# Patient Record
Sex: Male | Born: 1985 | Hispanic: No | Marital: Married | Smoking: Never smoker
Health system: Southern US, Community
[De-identification: ages and names within clinical notes are randomized; demographics above are authoritative.]

## PROBLEM LIST (undated history)

## (undated) DIAGNOSIS — K859 Acute pancreatitis without necrosis or infection, unspecified: Secondary | ICD-10-CM

## (undated) DIAGNOSIS — L309 Dermatitis, unspecified: Secondary | ICD-10-CM

## (undated) HISTORY — PX: TONSILLECTOMY: SUR1361

## (undated) HISTORY — DX: Dermatitis, unspecified: L30.9

---

## 2020-11-02 ENCOUNTER — Encounter (HOSPITAL_COMMUNITY): Payer: Self-pay

## 2020-11-02 ENCOUNTER — Emergency Department (HOSPITAL_COMMUNITY)
Admission: EM | Admit: 2020-11-02 | Discharge: 2020-11-03 | Disposition: A | Discharge status: Home / Self Care | Payer: 59 | Attending: Emergency Medicine | Admitting: Emergency Medicine

## 2020-11-02 ENCOUNTER — Other Ambulatory Visit: Payer: Self-pay

## 2020-11-02 DIAGNOSIS — Z5321 Procedure and treatment not carried out due to patient leaving prior to being seen by health care provider: Secondary | ICD-10-CM | POA: Insufficient documentation

## 2020-11-02 DIAGNOSIS — R1010 Upper abdominal pain, unspecified: Secondary | ICD-10-CM | POA: Diagnosis not present

## 2020-11-02 HISTORY — DX: Acute pancreatitis without necrosis or infection, unspecified: K85.90

## 2020-11-02 LAB — CBC
HCT: 42.8 % (ref 39.0–52.0)
Hemoglobin: 14.1 g/dL (ref 13.0–17.0)
MCH: 27.6 pg (ref 26.0–34.0)
MCHC: 32.9 g/dL (ref 30.0–36.0)
MCV: 83.8 fL (ref 80.0–100.0)
Platelets: 173 10*3/uL (ref 150–400)
RBC: 5.11 MIL/uL (ref 4.22–5.81)
RDW: 12.4 % (ref 11.5–15.5)
WBC: 5.6 10*3/uL (ref 4.0–10.5)
nRBC: 0 % (ref 0.0–0.2)

## 2020-11-02 LAB — COMPREHENSIVE METABOLIC PANEL
ALT: 24 U/L (ref 0–44)
AST: 35 U/L (ref 15–41)
Albumin: 4.2 g/dL (ref 3.5–5.0)
Alkaline Phosphatase: 44 U/L (ref 38–126)
Anion gap: 6 (ref 5–15)
BUN: 10 mg/dL (ref 6–20)
CO2: 26 mmol/L (ref 22–32)
Calcium: 9.2 mg/dL (ref 8.9–10.3)
Chloride: 106 mmol/L (ref 98–111)
Creatinine, Ser: 1.42 mg/dL — ABNORMAL HIGH (ref 0.61–1.24)
GFR, Estimated: >60 mL/min (ref >60)
Glucose, Bld: 100 mg/dL — ABNORMAL HIGH (ref 70–99)
Potassium: 4.3 mmol/L (ref 3.5–5.1)
Sodium: 138 mmol/L (ref 135–145)
Total Bilirubin: 1.1 mg/dL (ref 0.3–1.2)
Total Protein: 7.5 g/dL (ref 6.5–8.1)

## 2020-11-02 LAB — LIPASE, BLOOD: Lipase: 64 U/L — ABNORMAL HIGH (ref 11–51)

## 2020-11-02 NOTE — ED Notes (Signed)
Patient left on own accord °

## 2020-11-02 NOTE — ED Triage Notes (Signed)
Pt reports that he has been having right upper abdominal pain x 3 days. Diagnosed with pancreatitis 2 weeks ago. Denies nausea or vomiting. Denies fever. Worsens after eating.

## 2020-11-19 ENCOUNTER — Other Ambulatory Visit: Payer: Self-pay | Admitting: Gastroenterology

## 2020-11-19 ENCOUNTER — Other Ambulatory Visit (HOSPITAL_COMMUNITY): Payer: Self-pay | Admitting: Gastroenterology

## 2020-11-19 DIAGNOSIS — K859 Acute pancreatitis without necrosis or infection, unspecified: Secondary | ICD-10-CM

## 2020-12-11 ENCOUNTER — Encounter (HOSPITAL_COMMUNITY): Payer: Self-pay

## 2020-12-11 ENCOUNTER — Ambulatory Visit (HOSPITAL_COMMUNITY): Payer: 59

## 2020-12-29 ENCOUNTER — Ambulatory Visit (HOSPITAL_COMMUNITY)
Admission: RE | Admit: 2020-12-29 | Discharge: 2020-12-29 | Disposition: A | Discharge status: Home / Self Care | Payer: 59 | Source: Ambulatory Visit | Attending: Gastroenterology | Admitting: Gastroenterology

## 2020-12-29 ENCOUNTER — Other Ambulatory Visit: Payer: Self-pay

## 2020-12-29 DIAGNOSIS — K859 Acute pancreatitis without necrosis or infection, unspecified: Secondary | ICD-10-CM | POA: Diagnosis present

## 2020-12-29 MED ORDER — TECHNETIUM TC 99M MEBROFENIN IV KIT
5.3000 | PACK | Freq: Once | INTRAVENOUS | Status: AC | PRN
  Administered 2020-12-29: 5.3 via INTRAVENOUS

## 2020-12-30 ENCOUNTER — Encounter: Payer: Self-pay | Admitting: Allergy

## 2020-12-30 ENCOUNTER — Other Ambulatory Visit: Payer: Self-pay

## 2020-12-30 ENCOUNTER — Ambulatory Visit (INDEPENDENT_AMBULATORY_CARE_PROVIDER_SITE_OTHER): Payer: 59 | Admitting: Allergy

## 2020-12-30 VITALS — BP 122/82 | HR 59 | Temp 97.5°F | Resp 16 | Ht 73.31 in | Wt 225.4 lb

## 2020-12-30 DIAGNOSIS — R899 Unspecified abnormal finding in specimens from other organs, systems and tissues: Secondary | ICD-10-CM | POA: Insufficient documentation

## 2020-12-30 DIAGNOSIS — H1013 Acute atopic conjunctivitis, bilateral: Secondary | ICD-10-CM

## 2020-12-30 DIAGNOSIS — J3089 Other allergic rhinitis: Secondary | ICD-10-CM

## 2020-12-30 MED ORDER — FLUTICASONE PROPIONATE 50 MCG/ACT NA SUSP: 1.0000 | Freq: Two times a day (BID) | NASAL | 3 refills | Status: AC | PRN

## 2020-12-30 MED ORDER — CROMOLYN SODIUM 4 % OP SOLN: 1.0000 [drp] | Freq: Four times a day (QID) | OPHTHALMIC | 3 refills | Status: AC | PRN

## 2020-12-30 NOTE — Progress Notes (Signed)
New Patient Note  RE: Jacob Gill MRN: [id number] DOB: Jan 29, 1986 Date of Office Visit: 12/30/2020  Consult requested by: Kathi Der, MD Primary care provider: Joycelyn Rua, MD  Chief Complaint: elevated IgG 4 level  History of Present Illness: I had the pleasure of seeing [name] for initial evaluation at the [facility] on 12/30/2020. He is a 35 y.o. male, who is referred here by Dr. 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  Patient was having a lot of reflux symptoms and had mild acute pancreatitis - he had some elevated lipase and CT showed some mild inflammation in the ER. This was treated with fluids as outpatient.   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 Pancreatitis resolved and last lipase was normal.   Patient has NO history multiple infections including sinus infection, pneumonia, ear infections, GI infections/diarrhea, skin infections/abscesses. Patient has NO history of opportunistic infections including fungal infections, viral infections.    Patient labs from 11/20/2020: IgG 1337 IgG1 740 IgG2 264 IgG3 24 IgG4 157 (range 2-96)  Patient reports 0 antibiotic use in the last 12 months and 0 hospital admissions. Patient does not have any secondary causes of immunodeficiency including chronic steroid use, diabetes mellitus, protein losing enteropathy, hepatic dysfunction, history of cancer or irradiation or history of HIV, hepatitis B or C.   He has some slightly elevated Cr and is being followed by nephrology for this.  Assessment and Plan: Abdi is a 35 y.o. male with: Abnormal laboratory test result Patient referred by GI for IgG subclass 4 elevated at 157 (normal range 2-96)  after being work up for a mild case on  pancreatitis which resolved with outpatient therapy. Normal total IgG level. Denies any frequent infections or antibiotics use. Currently lipase back to normal.  Discussed with patient at length that given above clinical history and lab results, there is no indication for any further work up from immunology standpoint. If he is still concerned about autoimmune issues then recommend rheumatology evaluation next.   Other allergic rhinitis Perennial rhino conjunctivitis symptoms for 10 years. Tried zyrtec with some benefit. No prior work up. Would like environmental allergy testing done today.  Today's skin testing showed: Positive to tree pollen and cockroach.   Start environmental control measures as below.  May use over the counter antihistamines such as Zyrtec (cetirizine), Claritin (loratadine), Allegra (fexofenadine), or Xyzal (levocetirizine) daily as needed.  May use cromolyn 4% 1 drop in each eye up to four times a day as needed for itchy/watery eyes.   May use Flonase (fluticasone) nasal spray 1 spray per nostril twice a day as needed for nasal congestion.   Nasal saline spray (i.e., Simply Saline) or nasal saline lavage (i.e., NeilMed) is recommended as needed and prior to medicated nasal sprays.  Allergic conjunctivitis of both eyes  See assessment and plan as above for allergic rhinitis.  Return if symptoms worsen or fail to improve.  Meds ordered this encounter  Medications  . cromolyn (OPTICROM) 4 % ophthalmic solution    Sig: Place 1 drop into both eyes 4 (four) times daily as needed (itchy/watery eyes).    Dispense:  10 mL    Refill:  3  . fluticasone (FLONASE) 50 MCG/ACT nasal spray  Sig: Place 1 spray into both nostrils 2 (two) times daily as needed for allergies or rhinitis.    Dispense:  16 g    Refill:  3   Lab Orders  No laboratory test(s) ordered today    Other allergy screening: Asthma: no Rhino conjunctivitis: yes  He reports symptoms of nasal  congestion, rhinorrhea, itchy eyes. Symptoms have been going on for 10 years. The symptoms are present all year around. Other triggers include exposure to unsure. Anosmia: no. He has used zyrtec 10mg  daily with fair improvement in symptoms. Previous work up includes: none. History of reflux: yes and takes PPI.  Food allergy: no Medication allergy: no Hymenoptera allergy: no Urticaria: in 2014 x few months with no triggers. He was treated with zyrtec daily.  Eczema:no  Diagnostics: Skin Testing: Environmental allergy panel. Positive test to: tree pollen and cockroaches.  Results discussed with patient/family.  Airborne Adult Perc - 12/30/20 1409    Time Antigen Placed 1409    Allergen Manufacturer Waynette ButteryGreer    Location Back    Number of Test 59    Panel 1 Select    1. Control-Buffer 50% Glycerol Negative    2. Control-Histamine 1 mg/ml 2+    3. Albumin saline Negative    4. Bahia Negative    5. French Southern Territories[place]ermuda Negative    6. Johnson Negative    7. Kentucky Blue Negative    8. Meadow Fescue Negative    9. Perennial Rye Negative    10. Sweet Vernal Negative    11. Timothy Negative    12. Cocklebur Negative    13. Burweed Marshelder Negative    14. Ragweed, short Negative    15. Ragweed, Giant Negative    16. Plantain,  English Negative    17. Lamb's Quarters Negative    18. Sheep Sorrell Negative    19. Rough Pigweed Negative    20. Marsh Elder, Rough Negative    21. Mugwort, Common Negative    22. Ash mix Negative    23. Birch mix Negative    24. Beech American Negative    25. Box, Elder Negative    26. Cedar, red Negative    27. Cottonwood, Guinea-BissauEastern Negative    28. Elm mix Negative    29. Hickory 3+    30. Maple mix Negative    31. Oak, Guinea-Bissau[place]astern mix Negative    32. Pecan Pollen 2+    33. Pine mix Negative    34. Sycamore Eastern Negative    35. Walnut, Black Pollen Negative    36. Alternaria alternata Negative    37. Cladosporium Herbarum Negative    38. Aspergillus mix  Negative    39. Penicillium mix Negative    40. Bipolaris sorokiniana (Helminthosporium) Negative    41. Drechslera spicifera (Curvularia) Negative    42. Mucor plumbeus Negative    43. Fusarium moniliforme Negative    44. Aureobasidium pullulans (pullulara) Negative    45. Rhizopus oryzae Negative    46. Botrytis cinera Negative    47. Epicoccum nigrum Negative    48. Phoma betae Negative    49. Candida Albicans Negative    50. Trichophyton mentagrophytes Negative    51. Mite, D Farinae  5,000 AU/ml Negative    52. Mite, D Pteronyssinus  5,000 AU/ml Negative    53. Cat Hair 10,000 BAU/ml Negative    54.  Dog Epithelia Negative    55. Mixed Feathers Negative    56. Horse Epithelia Negative  57. Cockroach, German 2+    58. Mouse Negative    59. Tobacco Leaf Negative           Past Medical History: Patient Active Problem List   Diagnosis Date Noted  . Other allergic rhinitis 12/30/2020  . Allergic conjunctivitis of both eyes 12/30/2020  . Abnormal laboratory test result 12/30/2020   Past Medical History:  Diagnosis Date  . Eczema   . Pancreatitis    Past Surgical History: Past Surgical History:  Procedure Laterality Date  . TONSILLECTOMY     Medication List:  Current Outpatient Medications  Medication Sig Dispense Refill  . cabergoline (DOSTINEX) 0.5 MG tablet Take 0.5 mg by mouth 2 (two) times a week.    . cetirizine (ZYRTEC) 10 MG tablet Take by mouth.    . Cholecalciferol 25 MCG (1000 UT) tablet Take by mouth as needed.    . clindamycin (CLEOCIN T) 1 % external solution as needed.    . cromolyn (OPTICROM) 4 % ophthalmic solution Place 1 drop into both eyes 4 (four) times daily as needed (itchy/watery eyes). 10 mL 3  . fluticasone (FLONASE) 50 MCG/ACT nasal spray Place 1 spray into both nostrils 2 (two) times daily as needed for allergies or rhinitis. 16 g 3  . pantoprazole (PROTONIX) 20 MG tablet Take 40 mg by mouth daily.    . pantoprazole (PROTONIX) 40 MG  tablet 1 tablet     No current facility-administered medications for this visit.   Allergies: No Known Allergies Social History: Social History   Socioeconomic History  . Marital status: Married    Spouse name: Not on file  . Number of children: Not on file  . Years of education: Not on file  . Highest education level: Not on file  Occupational History  . Not on file  Tobacco Use  . Smoking status: Never Smoker  . Smokeless tobacco: Never Used  Vaping Use  . Vaping Use: Never used  Substance and Sexual Activity  . Alcohol use: Not Currently  . Drug use: Not Currently  . Sexual activity: Not on file  Other Topics Concern  . Not on file  Social History Narrative  . Not on file   Social Determinants of Health   Financial Resource Strain: Not on file  Food Insecurity: Not on file  Transportation Needs: Not on file  Physical Activity: Not on file  Stress: Not on file  Social Connections: Not on file   Lives in a 34 year old house. 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 Allergic rhinitis Father    Review of Systems  Constitutional: Negative for appetite change, chills, fever and unexpected weight change.  HENT: Positive for congestion. Negative for rhinorrhea.   Eyes: Positive for itching.  Respiratory: Negative for cough, chest tightness, shortness of breath and wheezing.   Cardiovascular: Negative for chest pain.  Gastrointestinal: Negative for abdominal pain.  Genitourinary: Negative for difficulty urinating.  Skin: Negative for rash.  Allergic/Immunologic: Positive for environmental allergies.  Neurological: Negative for headaches.   Objective: BP 122/82   Pulse (!) 59   Temp (!) 97.5 F (36.4 C) (Temporal)   Resp 16    Ht 6' 1.31" (1.862 m)   Wt 225 lb 6.4 oz (102.2 kg)   SpO2 98%  BMI 29.49 kg/m  Body mass index is 29.49 kg/m. Physical Exam Vitals and nursing note reviewed.  Constitutional:      Appearance: Normal appearance. He is well-developed.  HENT:     Head: Normocephalic and atraumatic.     Right Ear: Tympanic membrane and external ear normal.     Left Ear: Tympanic membrane and external ear normal.     Nose: Nose normal.     Mouth/Throat:     Mouth: Mucous membranes are moist.     Pharynx: Oropharynx is clear.  Eyes:     Conjunctiva/sclera: Conjunctivae normal.  Cardiovascular:     Rate and Rhythm: Normal rate and regular rhythm.     Heart sounds: Normal heart sounds. No murmur heard. No friction rub. No gallop.   Pulmonary:     Effort: Pulmonary effort is normal.     Breath sounds: Normal breath sounds. No wheezing, rhonchi or rales.  Musculoskeletal:     Cervical back: Neck supple.  Skin:    General: Skin is warm.     Findings: No rash.  Neurological:     Mental Status: He is alert and oriented to person, place, and time.  Psychiatric:        Behavior: Behavior normal.    The plan was reviewed with the patient/family, and all questions/concerned were addressed.  It was my pleasure to see Keywon today and participate in his care. Please feel free to contact me with any questions or concerns.  Sincerely,  Wyline Mood, DO Allergy & Immunology  [facility] office: [phone] [facility] office: [phone]

## 2020-12-30 NOTE — Assessment & Plan Note (Addendum)
Patient referred by GI for IgG subclass 4 elevated at 157 (normal range 2-96)  after being work up for a mild case on pancreatitis which resolved with outpatient therapy. Normal total IgG level. Denies any frequent infections or antibiotics use. Currently lipase back to normal.  Discussed with patient at length that given above clinical history and lab results, there is no indication for any further work up from immunology standpoint. If he is still concerned about autoimmune issues then recommend rheumatology evaluation next.

## 2020-12-30 NOTE — Patient Instructions (Addendum)
Today's skin testing showed: Positive to tree pollen and cockroach.   Environmental allergies  Start environmental control measures as below.  May use over the counter antihistamines such as Zyrtec (cetirizine), Claritin (loratadine), Allegra (fexofenadine), or Xyzal (levocetirizine) daily as needed.  May use cromolyn 4% 1 drop in each eye up to four times a day as needed for itchy/watery eyes.   May use Flonase (fluticasone) nasal spray 1 spray per nostril twice a day as needed for nasal congestion.   Nasal saline spray (i.e., Simply Saline) or nasal saline lavage (i.e., NeilMed) is recommended as needed and prior to medicated nasal sprays.  Elevated IgG subclass 4  No need for any further evaluation from immunology standpoint at this time.  Follow up as needed.  Reducing Pollen Exposure . Pollen seasons: trees (spring), grass (summer) and ragweed/weeds (fall). Marland Kitchen Keep windows closed in your home and car to lower pollen exposure.  [name] air conditioning in the bedroom and throughout the house if possible.  . Avoid going out in dry windy days - especially early morning. . Pollen counts are highest between 5 - 10 AM and on dry, hot and windy days.  . Save outside activities for late afternoon or after a heavy rain, when pollen levels are lower.  . Avoid mowing of grass if you have grass pollen allergy. Marland Kitchen Be aware that pollen can also be transported indoors on people and pets.  . Dry your clothes in an automatic dryer rather than hanging them outside where they might collect pollen.  . Rinse hair and eyes before bedtime.  Cockroach Allergen Avoidance Cockroaches are often found in the homes of densely populated urban areas, schools or commercial buildings, but these creatures can lurk almost anywhere. This does not mean that you have a dirty house or living area. . Block all areas where roaches can enter the home. This includes crevices, wall cracks and windows.  . Cockroaches  need water to survive, so fix and seal all leaky faucets and pipes. Have an exterminator go through the house when your family and pets are gone to eliminate any remaining roaches. Marland Kitchen Keep food in lidded containers and put pet food dishes away after your pets are done eating. Vacuum and sweep the floor after meals, and take out garbage and recyclables. Use lidded garbage containers in the kitchen. Wash dishes immediately after use and clean under stoves, refrigerators or toasters where crumbs can accumulate. Wipe off the stove and other kitchen surfaces and cupboards regularly.

## 2020-12-30 NOTE — Assessment & Plan Note (Signed)
   See assessment and plan as above for allergic rhinitis.  

## 2020-12-30 NOTE — Assessment & Plan Note (Signed)
Perennial rhino conjunctivitis symptoms for 10 years. Tried zyrtec with some benefit. No prior work up. Would like environmental allergy testing done today.  Today's skin testing showed: Positive to tree pollen and cockroach.   Start environmental control measures as below.  May use over the counter antihistamines such as Zyrtec (cetirizine), Claritin (loratadine), Allegra (fexofenadine), or Xyzal (levocetirizine) daily as needed.  May use cromolyn 4% 1 drop in each eye up to four times a day as needed for itchy/watery eyes.   May use Flonase (fluticasone) nasal spray 1 spray per nostril twice a day as needed for nasal congestion.   Nasal saline spray (i.e., Simply Saline) or nasal saline lavage (i.e., NeilMed) is recommended as needed and prior to medicated nasal sprays.

## 2021-02-20 ENCOUNTER — Other Ambulatory Visit: Payer: Self-pay | Admitting: Gastroenterology

## 2021-02-20 DIAGNOSIS — Z8719 Personal history of other diseases of the digestive system: Secondary | ICD-10-CM

## 2021-03-09 ENCOUNTER — Ambulatory Visit
Admission: RE | Admit: 2021-03-09 | Discharge: 2021-03-09 | Disposition: A | Discharge status: Home / Self Care | Payer: 59 | Source: Ambulatory Visit | Attending: Gastroenterology | Admitting: Gastroenterology

## 2021-03-09 DIAGNOSIS — Z8719 Personal history of other diseases of the digestive system: Secondary | ICD-10-CM

## 2021-03-09 MED ORDER — GADOBENATE DIMEGLUMINE 529 MG/ML IV SOLN
20.0000 mL | Freq: Once | INTRAVENOUS | Status: AC | PRN
  Administered 2021-03-09: 20 mL via INTRAVENOUS

## 2021-05-05 ENCOUNTER — Other Ambulatory Visit: Payer: Self-pay | Admitting: Nephrology

## 2021-05-05 DIAGNOSIS — N182 Chronic kidney disease, stage 2 (mild): Secondary | ICD-10-CM

## 2021-05-07 ENCOUNTER — Ambulatory Visit
Admission: RE | Admit: 2021-05-07 | Discharge: 2021-05-07 | Disposition: A | Discharge status: Home / Self Care | Payer: 59 | Source: Ambulatory Visit | Attending: Nephrology | Admitting: Nephrology

## 2021-05-07 DIAGNOSIS — N182 Chronic kidney disease, stage 2 (mild): Secondary | ICD-10-CM

## 2022-04-06 IMAGING — NM NM HEPATO W/GB/PHARM/[PERSON_NAME]
2 series · 12 of 12 positions shown · non-contrast
Comparison: None.

CLINICAL DATA: Abdomen pain and nausea

EXAM:
NUCLEAR MEDICINE HEPATOBILIARY IMAGING WITH GALLBLADDER EF
TECHNIQUE: Sequential images of the abdomen were obtained [DATE] minutes
following intravenous administration of radiopharmaceutical. After
oral ingestion of Ensure, gallbladder ejection fraction was
determined. At 60 min, normal ejection fraction is greater than 33%.
RADIOPHARMACEUTICALS:  5.3 mCi 6c-CCm  Choletec IV

[he hepatobiliary · 4.52mm/px · 6 of 60 frames shown (1 of 2)]
[frame 6/60]
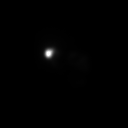
[frame 16/60]
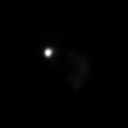
[frame 26/60]
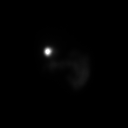
[frame 36/60]
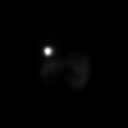
[frame 46/60]
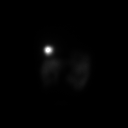
[frame 56/60]
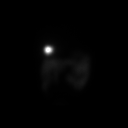

[he hepatobiliary · 4.52mm/px · 6 of 60 frames shown (2 of 2)]
[frame 6/60]
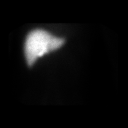
[frame 16/60]
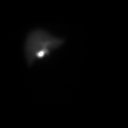
[frame 26/60]
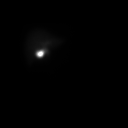
[frame 36/60]
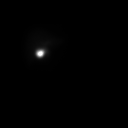
[frame 46/60]
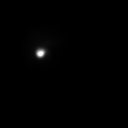
[frame 56/60]
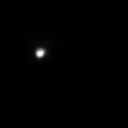

[12 of 12 positions shown; findings below may reference images not displayed]

FINDINGS: Prompt uptake and biliary excretion of activity by the liver is
seen. Gallbladder activity is visualized, consistent with patency of
cystic duct. Biliary activity passes into small bowel, consistent
with patent common bile duct.

Calculated gallbladder ejection fraction is 52%. (Normal gallbladder
ejection fraction with Ensure is greater than 33%.)
IMPRESSION: Negative examination
